# Patient Record
Sex: Male | Born: 1935 | Race: White | Hispanic: No
Health system: Southern US, Community
[De-identification: ages and names within clinical notes are randomized; demographics above are authoritative.]

## PROBLEM LIST (undated history)

## (undated) HISTORY — PX: CORONARY ARTERY BYPASS GRAFT: SHX141

---

## 2016-10-19 ENCOUNTER — Encounter (HOSPITAL_COMMUNITY): Payer: Self-pay | Admitting: Neurology

## 2016-10-19 ENCOUNTER — Inpatient Hospital Stay (HOSPITAL_COMMUNITY)
Admission: EM | Admit: 2016-10-19 | Discharge: 2016-10-21 | DRG: 194 | Disposition: A | Discharge status: Home / Self Care | Payer: Medicare Other | Attending: Internal Medicine | Admitting: Internal Medicine

## 2016-10-19 ENCOUNTER — Emergency Department (HOSPITAL_COMMUNITY): Payer: Medicare Other

## 2016-10-19 DIAGNOSIS — I739 Peripheral vascular disease, unspecified: Secondary | ICD-10-CM | POA: Diagnosis present

## 2016-10-19 DIAGNOSIS — R404 Transient alteration of awareness: Secondary | ICD-10-CM

## 2016-10-19 DIAGNOSIS — Z951 Presence of aortocoronary bypass graft: Secondary | ICD-10-CM | POA: Diagnosis not present

## 2016-10-19 DIAGNOSIS — E872 Acidosis: Secondary | ICD-10-CM | POA: Diagnosis present

## 2016-10-19 DIAGNOSIS — G309 Alzheimer's disease, unspecified: Secondary | ICD-10-CM | POA: Diagnosis present

## 2016-10-19 DIAGNOSIS — F0280 Dementia in other diseases classified elsewhere without behavioral disturbance: Secondary | ICD-10-CM | POA: Diagnosis present

## 2016-10-19 DIAGNOSIS — J189 Pneumonia, unspecified organism: Principal | ICD-10-CM | POA: Diagnosis present

## 2016-10-19 DIAGNOSIS — T68XXXA Hypothermia, initial encounter: Secondary | ICD-10-CM

## 2016-10-19 DIAGNOSIS — J431 Panlobular emphysema: Secondary | ICD-10-CM

## 2016-10-19 DIAGNOSIS — E785 Hyperlipidemia, unspecified: Secondary | ICD-10-CM | POA: Diagnosis present

## 2016-10-19 DIAGNOSIS — Z8249 Family history of ischemic heart disease and other diseases of the circulatory system: Secondary | ICD-10-CM

## 2016-10-19 DIAGNOSIS — Z720 Tobacco use: Secondary | ICD-10-CM

## 2016-10-19 DIAGNOSIS — F17210 Nicotine dependence, cigarettes, uncomplicated: Secondary | ICD-10-CM | POA: Diagnosis present

## 2016-10-19 DIAGNOSIS — I10 Essential (primary) hypertension: Secondary | ICD-10-CM | POA: Diagnosis present

## 2016-10-19 DIAGNOSIS — R680 Hypothermia, not associated with low environmental temperature: Secondary | ICD-10-CM | POA: Diagnosis present

## 2016-10-19 DIAGNOSIS — I2510 Atherosclerotic heart disease of native coronary artery without angina pectoris: Secondary | ICD-10-CM | POA: Diagnosis present

## 2016-10-19 DIAGNOSIS — F0390 Unspecified dementia without behavioral disturbance: Secondary | ICD-10-CM

## 2016-10-19 LAB — PROCALCITONIN: Procalcitonin: <0.1 ng/mL

## 2016-10-19 LAB — URINALYSIS, ROUTINE W REFLEX MICROSCOPIC
BILIRUBIN URINE: NEGATIVE
Glucose, UA: NEGATIVE mg/dL
HGB URINE DIPSTICK: NEGATIVE
Ketones, ur: NEGATIVE mg/dL
Leukocytes, UA: NEGATIVE
NITRITE: NEGATIVE
PROTEIN: NEGATIVE mg/dL
SPECIFIC GRAVITY, URINE: 1.009 (ref 1.005–1.030)
pH: 7 (ref 5.0–8.0)

## 2016-10-19 LAB — CBC WITH DIFFERENTIAL/PLATELET
Basophils Absolute: 0.1 10*3/uL (ref 0.0–0.1)
Basophils Relative: 1 %
EOS ABS: 0.2 10*3/uL (ref 0.0–0.7)
EOS PCT: 2 %
HCT: 47.3 % (ref 39.0–52.0)
Hemoglobin: 15.2 g/dL (ref 13.0–17.0)
Lymphocytes Relative: 22 %
Lymphs Abs: 2.1 10*3/uL (ref 0.7–4.0)
MCH: 29.5 pg (ref 26.0–34.0)
MCHC: 32.1 g/dL (ref 30.0–36.0)
MCV: 91.8 fL (ref 78.0–100.0)
MONO ABS: 0.4 10*3/uL (ref 0.1–1.0)
MONOS PCT: 5 %
Neutro Abs: 6.7 10*3/uL (ref 1.7–7.7)
Neutrophils Relative %: 70 %
PLATELETS: 257 10*3/uL (ref 150–400)
RBC: 5.15 MIL/uL (ref 4.22–5.81)
RDW: 14.4 % (ref 11.5–15.5)
WBC: 9.4 10*3/uL (ref 4.0–10.5)

## 2016-10-19 LAB — COMPREHENSIVE METABOLIC PANEL
ALT: 9 U/L — AB (ref 17–63)
ANION GAP: 8 (ref 5–15)
AST: 19 U/L (ref 15–41)
Albumin: 3.8 g/dL (ref 3.5–5.0)
Alkaline Phosphatase: 68 U/L (ref 38–126)
BUN: 16 mg/dL (ref 6–20)
CHLORIDE: 106 mmol/L (ref 101–111)
CO2: 24 mmol/L (ref 22–32)
CREATININE: 1.35 mg/dL — AB (ref 0.61–1.24)
Calcium: 8.6 mg/dL — ABNORMAL LOW (ref 8.9–10.3)
GFR, EST AFRICAN AMERICAN: 55 mL/min — AB (ref >60)
GFR, EST NON AFRICAN AMERICAN: 48 mL/min — AB (ref >60)
Glucose, Bld: 109 mg/dL — ABNORMAL HIGH (ref 65–99)
Potassium: 4 mmol/L (ref 3.5–5.1)
SODIUM: 138 mmol/L (ref 135–145)
Total Bilirubin: 0.5 mg/dL (ref 0.3–1.2)
Total Protein: 7 g/dL (ref 6.5–8.1)

## 2016-10-19 LAB — I-STAT CG4 LACTIC ACID, ED
LACTIC ACID, VENOUS: 0.81 mmol/L (ref 0.5–1.9)
Lactic Acid, Venous: 3.89 mmol/L (ref 0.5–1.9)

## 2016-10-19 LAB — INFLUENZA PANEL BY PCR (TYPE A & B)
INFLAPCR: NEGATIVE
INFLBPCR: NEGATIVE

## 2016-10-19 LAB — TROPONIN I

## 2016-10-19 LAB — I-STAT TROPONIN, ED: TROPONIN I, POC: 0.01 ng/mL (ref 0.00–0.08)

## 2016-10-19 MED ORDER — ALBUTEROL SULFATE (2.5 MG/3ML) 0.083% IN NEBU: 2.5000 mg | INHALATION_SOLUTION | RESPIRATORY_TRACT | Status: DC | PRN

## 2016-10-19 MED ORDER — AZITHROMYCIN 500 MG PO TABS
500.0000 mg | ORAL_TABLET | ORAL | Status: DC
  Administered 2016-10-20 – 2016-10-21 (×2): 500 mg via ORAL
  Filled 2016-10-19 (×2): qty 1

## 2016-10-19 MED ORDER — ASPIRIN EC 81 MG PO TBEC
81.0000 mg | DELAYED_RELEASE_TABLET | Freq: Every day | ORAL | Status: DC
  Administered 2016-10-20 – 2016-10-21 (×2): 81 mg via ORAL
  Filled 2016-10-19 (×2): qty 1

## 2016-10-19 MED ORDER — SODIUM CHLORIDE 0.9 % IV SOLN
Freq: Once | INTRAVENOUS | Status: AC
  Administered 2016-10-19: 12:00:00 via INTRAVENOUS

## 2016-10-19 MED ORDER — AMLODIPINE BESYLATE 10 MG PO TABS
10.0000 mg | ORAL_TABLET | Freq: Every day | ORAL | Status: DC
  Administered 2016-10-20 – 2016-10-21 (×2): 10 mg via ORAL
  Filled 2016-10-19 (×2): qty 1

## 2016-10-19 MED ORDER — PRAVASTATIN SODIUM 20 MG PO TABS
20.0000 mg | ORAL_TABLET | Freq: Every day | ORAL | Status: DC
  Administered 2016-10-20: 20 mg via ORAL
  Filled 2016-10-19: qty 1

## 2016-10-19 MED ORDER — DEXTROSE 5 % IV SOLN
500.0000 mg | Freq: Once | INTRAVENOUS | Status: AC
  Administered 2016-10-19: 500 mg via INTRAVENOUS
  Filled 2016-10-19: qty 500

## 2016-10-19 MED ORDER — ENOXAPARIN SODIUM 40 MG/0.4ML ~~LOC~~ SOLN
40.0000 mg | SUBCUTANEOUS | Status: DC
  Administered 2016-10-19 – 2016-10-20 (×2): 40 mg via SUBCUTANEOUS
  Filled 2016-10-19 (×2): qty 0.4

## 2016-10-19 MED ORDER — DEXTROSE 5 % IV SOLN
1.0000 g | INTRAVENOUS | Status: DC
  Administered 2016-10-20: 1 g via INTRAVENOUS
  Filled 2016-10-19 (×2): qty 10

## 2016-10-19 MED ORDER — ACETAMINOPHEN 650 MG RE SUPP: 650.0000 mg | Freq: Four times a day (QID) | RECTAL | Status: DC | PRN

## 2016-10-19 MED ORDER — POLYETHYLENE GLYCOL 3350 17 G PO PACK: 17.0000 g | PACK | Freq: Every day | ORAL | Status: DC | PRN

## 2016-10-19 MED ORDER — CEFTRIAXONE SODIUM 1 G IJ SOLR
1.0000 g | Freq: Once | INTRAMUSCULAR | Status: AC
  Administered 2016-10-19: 1 g via INTRAVENOUS
  Filled 2016-10-19: qty 10

## 2016-10-19 MED ORDER — SODIUM CHLORIDE 0.9% FLUSH
3.0000 mL | Freq: Two times a day (BID) | INTRAVENOUS | Status: DC
  Administered 2016-10-19 – 2016-10-21 (×3): 3 mL via INTRAVENOUS

## 2016-10-19 MED ORDER — SODIUM CHLORIDE 0.9 % IV BOLUS (SEPSIS)
1000.0000 mL | Freq: Once | INTRAVENOUS | Status: AC
  Administered 2016-10-19: 1000 mL via INTRAVENOUS

## 2016-10-19 MED ORDER — CLOPIDOGREL BISULFATE 75 MG PO TABS
75.0000 mg | ORAL_TABLET | Freq: Every day | ORAL | Status: DC
  Administered 2016-10-20 – 2016-10-21 (×2): 75 mg via ORAL
  Filled 2016-10-19 (×2): qty 1

## 2016-10-19 MED ORDER — SODIUM CHLORIDE 0.9 % IV SOLN
INTRAVENOUS | Status: DC
  Administered 2016-10-19 – 2016-10-20 (×2): via INTRAVENOUS

## 2016-10-19 MED ORDER — ACETAMINOPHEN 325 MG PO TABS: 650.0000 mg | ORAL_TABLET | Freq: Four times a day (QID) | ORAL | Status: DC | PRN

## 2016-10-19 NOTE — ED Notes (Signed)
Pt last oral temp was 98.5. Pt states he is getting too warm. Bair hugger removed. Will cont to monitor.

## 2016-10-19 NOTE — ED Triage Notes (Addendum)
Per ems- Pt was supposed go to his son's house this morning at 0800 to watch grand kids. He was late and his son looked outside and found him sitting in his truck with the windows down and truck turned off, truck was frosted. Pt unsure how long he was in the truck.  Pt was altered and confused. He was able to walk inside and sit down in front of the fireplace. EMS arrival patient was cool to touch with purple holds. Heat pack applied. While in transport pt HR dropped to 45, but increased to 70. Pt became more alert in transport. CBG 197.

## 2016-10-19 NOTE — H&P (Signed)
Date: 10/19/2016               Patient Name:  Joshua Wiley MRN: [id number]  DOB: Feb 16, 1933 Age / Sex: 83 y.o., male   PCP: Lindley MagnusAshley Long, PA-C         Medical Service: Internal Medicine Teaching Service         Attending Physician: Dr. Earl LagosNischal Narendra, MD    First Contact: Dr. Peggyann Juba'Sullivan Pager: [phone]  Second Contact: Dr. Earlene PlaterWallace Pager: [phone]       After Hours (After 5p/  First Contact Pager: [phone]  weekends / holidays): Second Contact Pager: [phone]   Chief Complaint: "I suddenly felt cold as an icicle."  History of Present Illness: Joshua Wiley is a 83 y.o. male with history of CAD (s/p CABG 2004), HTN, HL, Alzheimer's dementia (mild, good functional status), and PAD (s/p bilateral iliac stenting) presents with sudden onset hypothermia.  Collateral provided by daughter Misty StanleyLisa.  He was in his usual state of health yesterday with the exception of worsening of his baseline chronic cough and fatigue for the last several days, with increased sputum production.  This morning, he was planning to drive to his daughter's house to look after the grandchildren.  His son-in-law talked briefly to him on the phone to confirm the plans, and about 45 minutes later (about 20-30 minutes later than expected).  When he showed up, he seemed somewhat out of breath and very cold, but dressed appropriately with sweatshirt and winter coat.  He went inside and sat in front of the fire to warm up because he felt profoundly cold with chills.  He may have suffered transient reduced level of consciousness, sitting upright but slumping down.  In the ED, he was found to have T 95.7, and was actively warmed, started on empiric ceftriaxone and azithromycin, and given 1L NS.  Other than his worsening cough and fatigue, his ROS is negative, including for CP, edema, fevers/chills, weakness/numbness, and substance use.  He received flu shot this year.  Meds:  Current Meds  Medication Sig  . amLODipine  (NORVASC) 10 MG tablet Take 10 mg by mouth daily.  Marland Kitchen. aspirin EC 81 MG tablet Take 81 mg by mouth daily.  . clopidogrel (PLAVIX) 75 MG tablet Take 75 mg by mouth daily.  . pravastatin (PRAVACHOL) 20 MG tablet Take 20 mg by mouth daily.     Allergies: Allergies as of 10/19/2016  . (No Known Allergies)   Past Medical History  CAD s/p CABG HTN PAD s/p bilateral ileofemoral stents HL Inguinal hernia Dementia  Family History: HTN  Social History: Current 1 ppd smoker (~70 pk year history), rare alcohol with meals, no drugs  Review of Systems: A complete ROS was negative except as per HPI.  Physical Exam: Blood pressure 128/59, pulse (!) 58, temperature 98.5 F (36.9 C), temperature source Oral, resp. rate 14, weight 169 lb (76.7 kg), SpO2 95 %.  Physical Exam  Constitutional:  Alert, diaphoretic man in no distress under BAIR hugger  HENT:  Mouth/Throat: Oropharynx is clear and moist. No oropharyngeal exudate.  Eyes: Conjunctivae and EOM are normal. Pupils are equal, round, and reactive to light. No scleral icterus.  Neck: Normal range of motion. Neck supple.  Cardiovascular: Normal rate, regular rhythm and normal heart sounds.   Remote median sternom  Pulmonary/Chest:  No respiratory distress Good air movement Coarse rales in bilateral posterior lower lung fields No wheezes  Abdominal: Soft. He exhibits no distension. There is no  tenderness.  Musculoskeletal: He exhibits no edema or tenderness.  Neurological:  Alert and oriented Forgetful or recent and remote personal history.  Mental status at baseline per daughter CN intact Strength grossly intact in all extremities  Skin:  Warm, moist  Psychiatric: He has a normal mood and affect. His behavior is normal.   CBC Latest Ref Rng & Units 10/19/2016  WBC 4.0 - 10.5 K/uL 9.4  Hemoglobin 13.0 - 17.0 g/dL 16.1  Hematocrit 09.6 - 52.0 % 47.3  Platelets 150 - 400 K/uL 257   CMP Latest Ref Rng & Units 10/19/2016  Glucose 65  - 99 mg/dL 045(W)  BUN 6 - 20 mg/dL 16  Creatinine 0.98 - 1.19 mg/dL 1.47(W)  Sodium 295 - 621 mmol/L 138  Potassium 3.5 - 5.1 mmol/L 4.0  Chloride 101 - 111 mmol/L 106  CO2 22 - 32 mmol/L 24  Calcium 8.9 - 10.3 mg/dL 3.0(Q)  Total Protein 6.5 - 8.1 g/dL 7.0  Total Bilirubin 0.3 - 1.2 mg/dL 0.5  Alkaline Phos 38 - 126 U/L 68  AST 15 - 41 U/L 19  ALT 17 - 63 U/L 9(L)   Lactic Acid, Venous    Component Value Date/Time   LATICACIDVEN 3.89 (HH) 10/19/2016 1059   Component     Latest Ref Rng & Units 10/19/2016  Procalcitonin     ng/mL <0.10   CT Head 10/19/2016 IMPRESSION: 1. No acute intracranial abnormality. 2. Atrophy with chronic small vessel white matter ischemic disease. 3. 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  Chest Radiographs 10/19/2016 IMPRESSION: Bibasilar pneumonia and/or aspiration.  Background chronic lung disease which could be COPD and/or fibrosis.  EKG 10/19/2016 NSR, normal axis, no ST changes, TWI, Q waves  Assessment & Plan by Problem: Principal Problem:   Hypothermia Active Problems:   CAP (community acquired pneumonia)   Hypertension   Hyperlipidemia   Peripheral arterial disease (HCC)   Dementia   Tobacco use   83 y.o. male with hypothermia, AMS, lactic acidosis, and bibasilar opacities on CXR.  Environmental exposure seems less likely with only a short window of time unaccounted for and dressed appropriately.  Pneumonia and sepsis are most likely explanation.  #Hypothermia Most likely due to infection.  Other possibility include environmental exposure and MI.  -Troponin -Repeat EKG in AM -HIV  #CAP Flu PCR negative, procalcitonin low.  Lactic acidosis resolved. -Ceftriaxone and azithromycin -F/u BCx -Procalcitionin Q48H -Resp virus panel -Strep pneumo antigen -Sputum culture  #HTN Now hypertensive. -Continue home amlodipine,   #PAD #HL -Continue home  pravastatin, plavix, aspirin  #Tobacco Abuse -Declined nicotine patch  Fluids: NS 125 mL/hr Diet: heart healthy DVT Prophylaxis: lovenox Code Status: full  Dispo: Admit patient to Observation with expected length of stay less than 2 midnights.  Signed: Alm Bustard, MD 10/19/2016, 1:28 PM  Pager: [phone]

## 2016-10-19 NOTE — ED Provider Notes (Signed)
[facility]-EMERGENCY DEPT Provider Note   CSN: [id number] Arrival date & time: 10/19/16  2956     History   Chief Complaint Chief Complaint  Patient presents with  . Cold Exposure    HPI Joshua Wiley is a 83 y.o. male.  HPI Joshua Wiley is a 83 y.o. male with history of coronary disease, with CABG,  and peripheral arterial disease, presents to emergency department with hypothermia and altered mental status. Most of the history is provided by patient's son. From what patient recalls, he was feeling well this morning, woke up and was headed to his son's house to watch his children. He states he was able to get in the car and drive to the house and walk into the house. He does report feeling cold, otherwise he states he is not sure what happened and why he is here. According to son, he spoke with patient around 7 AM, patient was then supposed to get in the car and head over to his house. Patient did not get to his house until 8:15 AM. When he showed up at the door, he looked diaphoretic, pale, weak, disoriented, slurred speech. Patient appeared to be very cold. He sat him in front of the fireplace to warm hemoptysis and called EMS. EMS provided slightly different story, but confirmed with son. At this time, patient denies any pain. He is coughing, sneezing, watery draining eyes.     No past medical history on file.  There are no active problems to display for this patient.   Past Surgical History:  Procedure Laterality Date  . CORONARY ARTERY BYPASS GRAFT         Home Medications    Prior to Admission medications   Not on File    Family History No family history on file.  Social History Social History  Substance Use Topics  . Smoking status: Not on file  . Smokeless tobacco: Not on file  . Alcohol use Not on file     Allergies   Patient has no known allergies.   Review of Systems Review of Systems  Constitutional: Positive for diaphoresis. Negative for chills and  fever.  Eyes: Positive for discharge.  Respiratory: Positive for cough. Negative for chest tightness and shortness of breath.   Cardiovascular: Negative for chest pain, palpitations and leg swelling.  Gastrointestinal: Negative for abdominal distention, abdominal pain, diarrhea, nausea and vomiting.  Genitourinary: Negative for dysuria, frequency, hematuria and urgency.  Musculoskeletal: Negative for arthralgias, myalgias, neck pain and neck stiffness.  Skin: Negative for rash.  Allergic/Immunologic: Negative for immunocompromised state.  Neurological: Positive for weakness and light-headedness. Negative for numbness and headaches.  All other systems reviewed and are negative.    Physical Exam Updated Vital Signs BP (!) 163/105 (BP Location: Right Arm)   Pulse 69   Resp 16   SpO2 97%   Physical Exam  Constitutional: He is oriented to person, place, and time. He appears well-developed and well-nourished.  rigors present. Patient is coughing and sneezing  HENT:  Head: Normocephalic.  Right Ear: External ear normal.  Left Ear: External ear normal.  Nasal discharge.   Eyes: Pupils are equal, round, and reactive to light.  Bilateral purulent eye drainage, conjunctivae erythematous.  Neck: Normal range of motion. Neck supple.  Cardiovascular: Normal rate, regular rhythm, normal heart sounds and intact distal pulses.   Pulmonary/Chest: Effort normal. No respiratory distress. He has no wheezes. He has no rales.  Decreased air movement bilaterally  Abdominal: Soft. There is  no tenderness.  Musculoskeletal: Normal range of motion. He exhibits no edema.  Neurological: He is alert and oriented to person, place, and time. No cranial nerve deficit. Coordination normal.  Skin: Skin is warm and dry.  Nursing note and vitals reviewed.    ED Treatments / Results  Labs (all labs ordered are listed, but only abnormal results are displayed) Labs Reviewed  COMPREHENSIVE METABOLIC PANEL -  Abnormal; Notable for the following:       Result Value   Glucose, Bld 109 (*)    Creatinine, Ser 1.35 (*)    Calcium 8.6 (*)    ALT 9 (*)    GFR calc non Af Amer 48 (*)    GFR calc Af Amer 55 (*)    All other components within normal limits  I-STAT CG4 LACTIC ACID, ED - Abnormal; Notable for the following:    Lactic Acid, Venous 3.89 (*)    All other components within normal limits  CULTURE, BLOOD (ROUTINE X 2)  CULTURE, BLOOD (ROUTINE X 2)  URINE CULTURE  RESPIRATORY PANEL BY PCR  CBC WITH DIFFERENTIAL/PLATELET  URINALYSIS, ROUTINE W REFLEX MICROSCOPIC  INFLUENZA PANEL BY PCR (TYPE A & B, H1N1)  PROCALCITONIN  I-STAT TROPOININ, ED  I-STAT CG4 LACTIC ACID, ED    EKG  EKG Interpretation None       Radiology Dg Chest 2 View  Result Date: 10/19/2016 CLINICAL DATA:  Altered mental status EXAM: CHEST  2 VIEW COMPARISON:  None. FINDINGS: Bilateral basilar opacity with interstitial and airspace components. There is generalized interstitial coarsening and probable emphysematous change. Borderline cardiomegaly. Status post CABG. IMPRESSION: Bibasilar pneumonia and/or aspiration. Background chronic lung disease which could be COPD and/or fibrosis. Electronically Signed   By: Marnee SpringJonathon  Watts M.D.   On: 10/19/2016 11:22   Ct Head Wo Contrast  Result Date: 10/19/2016 CLINICAL DATA:  Mental status changes. EXAM: CT HEAD WITHOUT CONTRAST TECHNIQUE: Contiguous axial images were obtained from the base of the skull through the vertex without intravenous contrast. COMPARISON:  None. FINDINGS: Brain: There is no evidence for acute hemorrhage, hydrocephalus, mass lesion, or abnormal extra-axial fluid collection. No definite CT evidence for acute infarction. Diffuse loss of parenchymal volume is consistent with atrophy. Patchy low attenuation in the deep hemispheric and periventricular white matter is nonspecific, but likely reflects chronic microvascular ischemic demyelination. Old infarcts are seen  in the left frontoparietal region and parieto-occipital region. Lacunar infarcts noted inferior left cerebellum and right basal ganglia. Vascular: Atherosclerotic calcification is visualized in the carotid arteries. No dense MCA sign. Major dural sinuses are unremarkable. Skull: No evidence for fracture. No worrisome lytic or sclerotic lesion. Sinuses/Orbits: The visualized paranasal sinuses and mastoid air cells are clear. Visualized portions of the globes and intraorbital fat are unremarkable. Other: None. IMPRESSION: 1. No acute intracranial abnormality. 2. Atrophy with chronic small vessel white matter ischemic disease. 3. 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 Electronically Signed   By: Kennith CenterEric  Mansell M.D.   On: 10/19/2016 11:35    Procedures Procedures (including critical care time)  Medications Ordered in ED Medications  sodium chloride 0.9 % bolus 1,000 mL (not administered)     Initial Impression / Assessment and Plan / ED Course  I have reviewed the triage vital signs and the nursing notes.  Pertinent labs & imaging results that were available during my care of the patient were reviewed by me and considered in my medical decision  making (see chart for details).  Clinical Course    Patient seen and examined. Patient with episode of altered mental status, dizziness, slurred speech, diaphoresis, pale, possible cold exposure. According to the son, unsure what happened to the patient for proximally 45 minutes plus the drive to his house. Patient does feel cold to the touch. Unable to read oral temperature, patient is refusing rectal temperature. At this time he is alert and oriented, no complaints. Will warm with bare hugger and warm saline. His vital signs are normal otherwise, at this time I do not suspect sepsis, however will get blood work including lactic acid, blood cultures, chest x-ray, CT head, electrolytes  and blood counts.  12:01 PM CT head negative. Lactic acid is 3.89. Chest x-ray showing pneumonia. No recent admissions, will cover with Rocephin and Zithromax. Rectal temp is 95.7. In setting of hypothermia and pneumonia, will call code sepsis. Vital signs remain normal. Will get patient admitted for further evaluation and treatment. '  Sepsis - Repeat Assessment  Performed at:    1:00 PM  Vitals     Blood pressure 147/62, pulse (!) 55, temperature 98.5 F (36.9 C), temperature source Oral, resp. rate 13, weight 76.7 kg, SpO2 95 %.  Heart:     Regular rate and rhythm  Lungs:    Rhonchi  Capillary Refill:   <2 sec  Peripheral Pulse:   Radial pulse palpable  Skin:     Normal Color    Vitals:   10/19/16 1530 10/19/16 1600 10/19/16 1645 10/19/16 1700  BP: 130/82 138/58 149/66 147/62  Pulse: (!) 59 (!) 50 (!) 50 (!) 55  Resp: 21 12 14 13   Temp:      TempSrc:      SpO2: 94% 94% 93% 95%  Weight:         Final Clinical Impressions(s) / ED Diagnoses   Final diagnoses:  Community acquired pneumonia, unspecified laterality  Transient alteration of awareness    New Prescriptions Current Discharge Medication List       Jaynie Crumble, PA-C 10/19/16 1753    Shaune Pollack, MD 10/21/16 1030

## 2016-10-19 NOTE — ED Notes (Signed)
After several refusals for rectal temp pt agreed to have this performed, bear hugger applied and warm saline are already infusing - temp 95.7

## 2016-10-20 DIAGNOSIS — I739 Peripheral vascular disease, unspecified: Secondary | ICD-10-CM

## 2016-10-20 DIAGNOSIS — Z7982 Long term (current) use of aspirin: Secondary | ICD-10-CM

## 2016-10-20 DIAGNOSIS — F17210 Nicotine dependence, cigarettes, uncomplicated: Secondary | ICD-10-CM

## 2016-10-20 DIAGNOSIS — Z7902 Long term (current) use of antithrombotics/antiplatelets: Secondary | ICD-10-CM

## 2016-10-20 DIAGNOSIS — J189 Pneumonia, unspecified organism: Principal | ICD-10-CM

## 2016-10-20 DIAGNOSIS — Z8249 Family history of ischemic heart disease and other diseases of the circulatory system: Secondary | ICD-10-CM

## 2016-10-20 DIAGNOSIS — Z79899 Other long term (current) drug therapy: Secondary | ICD-10-CM

## 2016-10-20 DIAGNOSIS — I10 Essential (primary) hypertension: Secondary | ICD-10-CM

## 2016-10-20 LAB — BLOOD CULTURE ID PANEL (REFLEXED)
Acinetobacter baumannii: NOT DETECTED
CANDIDA ALBICANS: NOT DETECTED
CANDIDA KRUSEI: NOT DETECTED
CANDIDA PARAPSILOSIS: NOT DETECTED
Candida glabrata: NOT DETECTED
Candida tropicalis: NOT DETECTED
ESCHERICHIA COLI: NOT DETECTED
Enterobacter cloacae complex: NOT DETECTED
Enterobacteriaceae species: NOT DETECTED
Enterococcus species: NOT DETECTED
HAEMOPHILUS INFLUENZAE: NOT DETECTED
KLEBSIELLA OXYTOCA: NOT DETECTED
KLEBSIELLA PNEUMONIAE: NOT DETECTED
Listeria monocytogenes: NOT DETECTED
METHICILLIN RESISTANCE: NOT DETECTED
Neisseria meningitidis: NOT DETECTED
PSEUDOMONAS AERUGINOSA: NOT DETECTED
Proteus species: NOT DETECTED
SERRATIA MARCESCENS: NOT DETECTED
STAPHYLOCOCCUS AUREUS BCID: NOT DETECTED
STREPTOCOCCUS PNEUMONIAE: NOT DETECTED
STREPTOCOCCUS PYOGENES: NOT DETECTED
Staphylococcus species: DETECTED — AB
Streptococcus agalactiae: NOT DETECTED
Streptococcus species: NOT DETECTED

## 2016-10-20 LAB — RESPIRATORY PANEL BY PCR
ADENOVIRUS-RVPPCR: NOT DETECTED
Bordetella pertussis: NOT DETECTED
CHLAMYDOPHILA PNEUMONIAE-RVPPCR: NOT DETECTED
CORONAVIRUS NL63-RVPPCR: NOT DETECTED
Coronavirus 229E: NOT DETECTED
Coronavirus HKU1: NOT DETECTED
Coronavirus OC43: NOT DETECTED
INFLUENZA A-RVPPCR: NOT DETECTED
Influenza B: NOT DETECTED
METAPNEUMOVIRUS-RVPPCR: NOT DETECTED
Mycoplasma pneumoniae: NOT DETECTED
PARAINFLUENZA VIRUS 2-RVPPCR: NOT DETECTED
PARAINFLUENZA VIRUS 3-RVPPCR: NOT DETECTED
Parainfluenza Virus 1: NOT DETECTED
Parainfluenza Virus 4: NOT DETECTED
RHINOVIRUS / ENTEROVIRUS - RVPPCR: NOT DETECTED
Respiratory Syncytial Virus: NOT DETECTED

## 2016-10-20 LAB — BASIC METABOLIC PANEL
Anion gap: 10 (ref 5–15)
BUN: 12 mg/dL (ref 6–20)
CALCIUM: 9 mg/dL (ref 8.9–10.3)
CHLORIDE: 109 mmol/L (ref 101–111)
CO2: 21 mmol/L — AB (ref 22–32)
CREATININE: 1.22 mg/dL (ref 0.61–1.24)
GFR calc Af Amer: >60 mL/min (ref >60)
GFR calc non Af Amer: 54 mL/min — ABNORMAL LOW (ref >60)
GLUCOSE: 85 mg/dL (ref 65–99)
Potassium: 4.1 mmol/L (ref 3.5–5.1)
Sodium: 140 mmol/L (ref 135–145)

## 2016-10-20 LAB — CBC
HCT: 45.8 % (ref 39.0–52.0)
Hemoglobin: 15.2 g/dL (ref 13.0–17.0)
MCH: 29.6 pg (ref 26.0–34.0)
MCHC: 33.2 g/dL (ref 30.0–36.0)
MCV: 89.3 fL (ref 78.0–100.0)
PLATELETS: 251 10*3/uL (ref 150–400)
RBC: 5.13 MIL/uL (ref 4.22–5.81)
RDW: 13.9 % (ref 11.5–15.5)
WBC: 13.1 10*3/uL — ABNORMAL HIGH (ref 4.0–10.5)

## 2016-10-20 LAB — STREP PNEUMONIAE URINARY ANTIGEN: STREP PNEUMO URINARY ANTIGEN: NEGATIVE

## 2016-10-20 LAB — URINE CULTURE: Culture: <10000 — AB

## 2016-10-20 LAB — HIV ANTIBODY (ROUTINE TESTING W REFLEX): HIV Screen 4th Generation wRfx: NONREACTIVE

## 2016-10-20 NOTE — Progress Notes (Signed)
   Subjective: Feels well, with no chills, or dyspnea.  Per daughter Misty StanleyLisa he continue to have a cough worse than baseline, but mental status is at his baseline.  Objective:  Vital signs in last 24 hours: Vitals:   10/19/16 1802 10/19/16 2258 10/20/16 0347 10/20/16 0622  BP: (!) 173/55 (!) 142/77  (!) 141/48  Pulse: (!) 58 66  (!) 42  Resp: 20 16  17   Temp: 97.6 F (36.4 C) 97.6 F (36.4 C)  97.5 F (36.4 C)  TempSrc: Oral Oral  Oral  SpO2: 97% 97%  96%  Weight: 166 lb 9.6 oz (75.6 kg)  159 lb 6.4 oz (72.3 kg)   Height: 5\' 10"  (1.778 m)      Physical Exam  Constitutional: He is oriented to person, place, and time.  Sitting in chair eating lunch in no distress  Cardiovascular: Normal rate, regular rhythm and normal heart sounds.   Pulmonary/Chest: Effort normal. No respiratory distress.  Coarse bibasilar rales  Abdominal: Soft. He exhibits no distension. There is no tenderness.  Neurological: He is alert and oriented to person, place, and time.  Skin: Skin is warm and dry.  Psychiatric: He has a normal mood and affect. His behavior is normal.   EKG 10/20/2016 Sinus bradycardia, normal axis, no ST changes, TWI, Q waves.  Biphasic P in V1, possible LA enlargement.  CBC Latest Ref Rng & Units 10/20/2016 10/19/2016  WBC 4.0 - 10.5 K/uL 13.1(H) 9.4  Hemoglobin 13.0 - 17.0 g/dL 13.215.2 44.015.2  Hematocrit 10.239.0 - 52.0 % 45.8 47.3  Platelets 150 - 400 K/uL 251 257   BMP Latest Ref Rng & Units 10/20/2016 10/19/2016  Glucose 65 - 99 mg/dL 85 725(D109(H)  BUN 6 - 20 mg/dL 12 16  Creatinine 6.640.61 - 1.24 mg/dL 4.031.22 4.74(Q1.35(H)  Sodium 595135 - 145 mmol/L 140 138  Potassium 3.5 - 5.1 mmol/L 4.1 4.0  Chloride 101 - 111 mmol/L 109 106  CO2 22 - 32 mmol/L 21(L) 24  Calcium 8.9 - 10.3 mg/dL 9.0 6.3(O8.6(L)   Cardiac Panel (last 3 results)  Recent Labs  10/19/16 2103  TROPONINI <0.03   Blood Cultures 10/19/2016 2/2 cultures growing GPC in clusters BCID Staph species, neg Staph aureus, neg methicillin  resistance  Assessment/Plan:  Principal Problem:   Hypothermia Active Problems:   CAP (community acquired pneumonia)   Hypertension   Hyperlipidemia   Peripheral arterial disease (HCC)   Dementia   Tobacco use  83 y.o. male with hypothermia, AMS, lactic acidosis, and bibasilar opacities on CXR consistent with pneumonia.  His blood cultures are growing coag negative staph per BCID, awaiting culture speciation and sensitivities.  He is clinically stable with stable hemodynamics and respiratory function.  #CAP Blood cultures growing Staph which BCID suggests is coagulase negative.  Flu PCR negative, respiratory virus PCR negative, procalcitonin low, lactic acidosis resolved. -Ceftriaxone and azithromycin for CAP (day 2, started 10/19/2016) -F/u BCx -Procalcitionin Q48H -F/u Strep pneumo antigen -F/u Sputum culture  #Hypothermia Most likely due to infection.  Other possibility include environmental exposure.  MI unlikely with nonischemic EKG and undetectable troponin.  #HTN No current concern for hypotension/sepsis with mildly elevated BPs. -Continue home amlodipine  #PAD #HL -Continue home pravastatin, plavix, aspirin  Fluids: none Diet: heart DVT Prophylaxis: lovenox Code Status: full  Dispo: Anticipated discharge in approximately 1-2 day(s).   Alm BustardMatthew O'Sullivan, MD 10/20/2016, 9:25 AM Pager: [phone]

## 2016-10-20 NOTE — Progress Notes (Signed)
  Date: 10/20/2016  Patient name: Joshua Wiley  Medical record number: [id number]  Date of birth: [birth year]   I have seen and evaluated Joshua Wiley and discussed their care with the Residency Team. In brief, patient is a 83 y/o male with PMH of CAD s/p CABG, HTN, HLD, mild dementia, PAD who p/w hypothermia.  Patient was feeling well the morning of admission but when he arrived at his daughter's house later that morning he was noted to be very cold and had chills as well as somnolence. He sat in front of the fire to warm up but had persistent symptoms so was brought to the hospital. In ED was found to have a temp of 95.7 F and was started on empiric abx as well as a warming blanket. He denies fevers, no abd pain, no n/v, no diarrhea, no syncope, no focal weakness, no CP, no SOB, no palpitations, no diaphoresis.  Today, patient feels well and says he is back to his baseline  PMHx, Fam Hx, and/or Soc Hx : as per resident admit note  Vitals:   10/20/16 0622 10/20/16 0930  BP: (!) 141/48 (!) 151/52  Pulse: (!) 42   Resp: 17   Temp: 97.5 F (36.4 C)    Gen: AAO*3, NAD CVS: RRR, normal heart sounds Lungs: bibasilar crackles + Abd: soft, non tender, BS + Ext: no edema  Assessment and Plan: I have seen and evaluated the patient as outlined above. I agree with the formulated Assessment and Plan as detailed in the residents' note, with the following changes:   1. Community acquired PNA: - Patient presents with hypothermia, somnolence and found to have an elevated lactic acid on admission, now with leukocytosis as well with bibasilar infiltrates on CXR consistent with PNA - c/w ceftriaxone and azithromycin for now - Patient with positive blood cx with gram + cocci in clusters consistent with staph but ID panel does not show staph aureus. I am inclined to believe that this is a contaminant - given that one is growing in the aerobic bottle only and the other is growing only in the anaerobic bottle  and his symptoms are consistent with a PNA which would unlikely be caused by a staph bacteria that is not staph aureus. - Will await speciation of blood cx. Consider repeat blood cx in AM - f/u sputum cx and strep pneumo antigen    Earl LagosNischal Rogen Porte, MD 1/3/20188:17 PM

## 2016-10-20 NOTE — Progress Notes (Signed)
PHARMACY - PHYSICIAN COMMUNICATION CRITICAL VALUE ALERT - BLOOD CULTURE IDENTIFICATION (BCID)  Results for orders placed or performed during the hospital encounter of 10/19/16  Blood Culture ID Panel (Reflexed) (Collected: 10/19/2016 10:42 AM)  Result Value Ref Range   Enterococcus species NOT DETECTED NOT DETECTED   Listeria monocytogenes NOT DETECTED NOT DETECTED   Staphylococcus species DETECTED (A) NOT DETECTED   Staphylococcus aureus NOT DETECTED NOT DETECTED   Methicillin resistance NOT DETECTED NOT DETECTED   Streptococcus species NOT DETECTED NOT DETECTED   Streptococcus agalactiae NOT DETECTED NOT DETECTED   Streptococcus pneumoniae NOT DETECTED NOT DETECTED   Streptococcus pyogenes NOT DETECTED NOT DETECTED   Acinetobacter baumannii NOT DETECTED NOT DETECTED   Enterobacteriaceae species NOT DETECTED NOT DETECTED   Enterobacter cloacae complex NOT DETECTED NOT DETECTED   Escherichia coli NOT DETECTED NOT DETECTED   Klebsiella oxytoca NOT DETECTED NOT DETECTED   Klebsiella pneumoniae NOT DETECTED NOT DETECTED   Proteus species NOT DETECTED NOT DETECTED   Serratia marcescens NOT DETECTED NOT DETECTED   Haemophilus influenzae NOT DETECTED NOT DETECTED   Neisseria meningitidis NOT DETECTED NOT DETECTED   Pseudomonas aeruginosa NOT DETECTED NOT DETECTED   Candida albicans NOT DETECTED NOT DETECTED   Candida glabrata NOT DETECTED NOT DETECTED   Candida krusei NOT DETECTED NOT DETECTED   Candida parapsilosis NOT DETECTED NOT DETECTED   Candida tropicalis NOT DETECTED NOT DETECTED    Name of physician (or Provider) Contacted: Dr. Peggyann Juba'Sullivan   Changes to prescribed antibiotics required: Patient is currently on ceftriaxone and azithromycin for CAP. Given that this is likely contaminant, no changes to current therapy at this time.   Carylon PerchesMaggie Shuda, PharmD Acute Care Pharmacy Resident  Pager: [phone] 10/20/2016

## 2016-10-20 NOTE — Evaluation (Signed)
Physical Therapy Evaluation Patient Details Name: Joshua Wiley MRN: [id number] DOB: 12-02-1932 Today's Date: 10/20/2016   History of Present Illness  Jonathan Corpus is a 83 y.o. male with history of CAD (s/p CABG 2004), HTN, HL, Alzheimer's dementia (mild, good functional status), and PAD (s/p bilateral iliac stenting) presents with sudden onset hypothermia.  Clinical Impression  Patient presents with some deficits in balance slightly more than baseline, but feel he will improve as resumes normal daily routine with son's supervision.  No current plans for follow up PT and feel can mobilize safely with nursing assist.  Will sign off.    Follow Up Recommendations No PT follow up    Equipment Recommendations  None recommended by PT    Recommendations for Other Services       Precautions / Restrictions Precautions Precautions: Fall      Mobility  Bed Mobility               General bed mobility comments: up in chair  Transfers Overall transfer level: Needs assistance Equipment used: None Transfers: Sit to/from Stand Sit to Stand: Supervision         General transfer comment: reliant on UE suppot and slow to rise  Ambulation/Gait Ambulation/Gait assistance: Supervision;Min guard Ambulation Distance (Feet): 200 Feet Assistive device: None Gait Pattern/deviations: Step-through pattern;Drifts right/left;Decreased stride length;Scissoring     General Gait Details: some cross steps to catch balance, veers to sides at times esp with head turns, occasional minguard for safety   Stairs            Wheelchair Mobility    Modified Rankin (Stroke Patients Only)       Balance Overall balance assessment: Needs assistance           Standing balance-Leahy Scale: Good                               Pertinent Vitals/Pain Pain Assessment: No/denies pain    Home Living Family/patient expects to be discharged to:: Private residence Living Arrangements:  Children Available Help at Discharge: Family;Available PRN/intermittently Type of Home: House Home Access: Level entry     Home Layout: One level Home Equipment: Cane - single point      Prior Function Level of Independence: Independent               Hand Dominance        Extremity/Trunk Assessment   Upper Extremity Assessment Upper Extremity Assessment: Overall WFL for tasks assessed    Lower Extremity Assessment Lower Extremity Assessment: LLE deficits/detail;RLE deficits/detail RLE Deficits / Details: WFL AROM and strength; reports LE numbness since getting cold RLE Sensation: decreased light touch LLE Deficits / Details: WFL AROM and strength; reports LE numbness since getting cold LLE Sensation: decreased light touch       Communication   Communication: No difficulties  Cognition Arousal/Alertness: Awake/alert Behavior During Therapy: WFL for tasks assessed/performed Overall Cognitive Status: History of cognitive impairments - at baseline                      General Comments General comments (skin integrity, edema, etc.): discussed with pt and daughter fall prevention tips for home and having initial supervision    Exercises     Assessment/Plan    PT Assessment Patent does not need any further PT services  PT Problem List            PT  Treatment Interventions      PT Goals (Current goals can be found in the Care Plan section)  Acute Rehab PT Goals Patient Stated Goal: To go home PT Goal Formulation: All assessment and education complete, DC therapy    Frequency     Barriers to discharge        Co-evaluation               End of Session Equipment Utilized During Treatment: Gait belt Activity Tolerance: Patient tolerated treatment well Patient left: in chair;with call bell/phone within reach;with family/visitor present      Functional Assessment Tool Used: Clinical Judgement Functional Limitation: Mobility: Walking  and moving around Mobility: Walking and Moving Around Current Status ([id number]): At least 1 percent but less than 20 percent impaired, limited or restricted Mobility: Walking and Moving Around Goal Status [phone]): At least 1 percent but less than 20 percent impaired, limited or restricted Mobility: Walking and Moving Around Discharge Status [phone](G8980): At least 1 percent but less than 20 percent impaired, limited or restricted    Time: [id number] PT Time Calculation (min) (ACUTE ONLY): 22 min   Charges:   PT Evaluation $PT Eval Moderate Complexity: 1 Procedure     PT G Codes:   PT G-Codes **NOT FOR INPATIENT CLASS** Functional Assessment Tool Used: Clinical Judgement Functional Limitation: Mobility: Walking and moving around Mobility: Walking and Moving Around Current Status ([id number](G8978): At least 1 percent but less than 20 percent impaired, limited or restricted Mobility: Walking and Moving Around Goal Status [phone]([phone]): At least 1 percent but less than 20 percent impaired, limited or restricted Mobility: Walking and Moving Around Discharge Status [phone]([phone]): At least 1 percent but less than 20 percent impaired, limited or restricted    Elray McgregorCynthia Tekoa Amon 10/20/2016, 12:56 PM  Sheran Lawlessyndi Kamuela Magos, PT [phone] 10/20/2016

## 2016-10-21 DIAGNOSIS — J431 Panlobular emphysema: Secondary | ICD-10-CM

## 2016-10-21 DIAGNOSIS — R404 Transient alteration of awareness: Secondary | ICD-10-CM

## 2016-10-21 LAB — BASIC METABOLIC PANEL
Anion gap: 10 (ref 5–15)
BUN: 13 mg/dL (ref 6–20)
CO2: 25 mmol/L (ref 22–32)
CREATININE: 1.37 mg/dL — AB (ref 0.61–1.24)
Calcium: 9.4 mg/dL (ref 8.9–10.3)
Chloride: 104 mmol/L (ref 101–111)
GFR calc Af Amer: 54 mL/min — ABNORMAL LOW (ref >60)
GFR, EST NON AFRICAN AMERICAN: 47 mL/min — AB (ref >60)
Glucose, Bld: 100 mg/dL — ABNORMAL HIGH (ref 65–99)
Potassium: 4.7 mmol/L (ref 3.5–5.1)
SODIUM: 139 mmol/L (ref 135–145)

## 2016-10-21 LAB — CBC
HCT: 48.7 % (ref 39.0–52.0)
Hemoglobin: 16.2 g/dL (ref 13.0–17.0)
MCH: 29.5 pg (ref 26.0–34.0)
MCHC: 33.3 g/dL (ref 30.0–36.0)
MCV: 88.7 fL (ref 78.0–100.0)
Platelets: 244 10*3/uL (ref 150–400)
RBC: 5.49 MIL/uL (ref 4.22–5.81)
RDW: 13.8 % (ref 11.5–15.5)
WBC: 10.4 10*3/uL (ref 4.0–10.5)

## 2016-10-21 LAB — PROCALCITONIN: Procalcitonin: <0.1 ng/mL

## 2016-10-21 MED ORDER — NICOTINE 14 MG/24HR TD PT24: 14.0000 mg | MEDICATED_PATCH | Freq: Every day | TRANSDERMAL | 0 refills | Status: AC

## 2016-10-21 MED ORDER — CEFDINIR 300 MG PO CAPS: 300.0000 mg | ORAL_CAPSULE | Freq: Two times a day (BID) | ORAL | 0 refills | Status: AC

## 2016-10-21 MED ORDER — DEXTROSE 5 % IV SOLN
2.0000 g | INTRAVENOUS | Status: DC
  Administered 2016-10-21: 2 g via INTRAVENOUS
  Filled 2016-10-21: qty 2

## 2016-10-21 MED ORDER — AZITHROMYCIN 500 MG PO TABS: 500.0000 mg | ORAL_TABLET | ORAL | 0 refills | Status: AC

## 2016-10-21 NOTE — Progress Notes (Addendum)
Subjective: Feels well, with no chills, or dyspnea.  Wants to go home.  Objective:  Vital signs in last 24 hours: Vitals:   10/20/16 2128 10/21/16 0547 10/21/16 0619 10/21/16 1053  BP: (!) 163/63 (!) 166/71 (!) 146/73 (!) 162/83  Pulse: (!) 51 60 (!) 57   Resp: 18 16    Temp: 97.6 F (36.4 C) 97.6 F (36.4 C)    TempSrc: Oral Oral    SpO2: 100% 97%    Weight:   160 lb 6.4 oz (72.8 kg)   Height:       Physical Exam  Constitutional: He is oriented to person, place, and time.  Sitting in chair and in no distress  Cardiovascular: Normal rate, regular rhythm and normal heart sounds.   Pulmonary/Chest: Effort normal. No respiratory distress.  Bibasilar crackles.  Abdominal: Soft. He exhibits no distension. There is no tenderness.  Neurological: He is alert and oriented to person, place, and time.  Skin: Skin is warm and dry.  Psychiatric: He has a normal mood and affect. His behavior is normal.   EKG 10/20/2016 Sinus bradycardia, normal axis, no ST changes, TWI, Q waves.  Biphasic P in V1, possible LA enlargement.  CBC Latest Ref Rng & Units 10/21/2016 10/20/2016 10/19/2016  WBC 4.0 - 10.5 K/uL 10.4 13.1(H) 9.4  Hemoglobin 13.0 - 17.0 g/dL 16.1 09.6 04.5  Hematocrit 39.0 - 52.0 % 48.7 45.8 47.3  Platelets 150 - 400 K/uL 244 251 257   BMP Latest Ref Rng & Units 10/21/2016 10/20/2016 10/19/2016  Glucose 65 - 99 mg/dL 409(W) 85 119(J)  BUN 6 - 20 mg/dL 13 12 16   Creatinine 0.61 - 1.24 mg/dL 4.78(G) 9.56 2.13(Y)  Sodium 135 - 145 mmol/L 139 140 138  Potassium 3.5 - 5.1 mmol/L 4.7 4.1 4.0  Chloride 101 - 111 mmol/L 104 109 106  CO2 22 - 32 mmol/L 25 21(L) 24  Calcium 8.9 - 10.3 mg/dL 9.4 9.0 8.6(V)   Cardiac Panel (last 3 results)  Recent Labs  10/19/16 2103  TROPONINI <0.03   Blood Cultures 10/19/2016 2/2 cultures growing GPC in clusters (one in aerobic only, one in anaerobic only) BCID Staph species, neg Staph aureus, neg methicillin resistance  Assessment/Plan:  Principal  Problem:   Hypothermia Active Problems:   CAP (community acquired pneumonia)   Hypertension   Hyperlipidemia   Peripheral arterial disease (HCC)   Dementia   Tobacco use  83 y.o. male with hypothermia, AMS, lactic acidosis, and bibasilar opacities on CXR consistent with pneumonia.  His blood cultures are growing coag negative staph per BCID, awaiting culture speciation and sensitivities.  He is clinically stable with stable hemodynamics and respiratory function.  #CAP Blood cultures growing GPC in clusters which BCID shows as Staph species but not Staph aureus and no methicillin resistance.  Flu PCR negative, respiratory virus PCR negative, procalcitonin low, lactic acidosis resolved. -Will continue with antibiotics for CAP treatment as outpatient - Cefdinir (end date 1/12) and Azithromycin (end date 1/6) -Blood cultures growing likely CoNS which may very well be contaminant.  Discussed with ID this morning who recommended extending course of Cefdinir for 10 days total -F/u final speciation -Repeat blood cx today  #Hypothermia Most likely due to infection.  Other possibility include environmental exposure.  MI unlikely with nonischemic EKG and undetectable troponin.  #HTN No current concern for hypotension/sepsis with mildly elevated BPs. -Continue home amlodipine  #PAD #HL -Continue home pravastatin, plavix, aspirin  Fluids: none Diet: heart DVT Prophylaxis: lovenox  Code Status: full  Dispo: Anticipated discharge today.  Gwynn BurlyAndrew Leonid Manus, DO 10/21/2016, 12:11 PM Pager: [phone]

## 2016-10-21 NOTE — Discharge Summary (Signed)
Name: Joshua Wiley MRN: [id number] DOB: 02-27-1933 83 y.o. PCP: Lindley MagnusAshley Long, PA-C  Date of Admission: 10/19/2016  9:53 AM Date of Discharge: 10/21/2016 Attending Physician: Earl LagosNischal Narendra, MD  Discharge Diagnosis:  Principal Problem:   Community acquired pneumonia Active Problems:   Hypothermia   Hypertension   Hyperlipidemia   Peripheral arterial disease (HCC)   Dementia   Tobacco use   Transient alteration of awareness   Panlobular emphysema (HCC)   Discharge Medications: Allergies as of 10/21/2016   No Known Allergies     Medication List    TAKE these medications   amLODipine 10 MG tablet Commonly known as:  NORVASC Take 10 mg by mouth daily.   aspirin EC 81 MG tablet Take 81 mg by mouth daily.   azithromycin 500 MG tablet Commonly known as:  ZITHROMAX Take 1 tablet (500 mg total) by mouth daily. Start taking on:  10/22/2016   cefdinir 300 MG capsule Commonly known as:  OMNICEF Take 1 capsule (300 mg total) by mouth 2 (two) times daily. Start taking on:  10/22/2016   clopidogrel 75 MG tablet Commonly known as:  PLAVIX Take 75 mg by mouth daily.   pravastatin 20 MG tablet Commonly known as:  PRAVACHOL Take 20 mg by mouth daily.       Disposition and follow-up:   Joshua.Joshua Wiley [facility] Stable [facility].  At the hospital follow up visit please address:  1.  Pneumonia.  Ask about symptoms of dyspnea, cough, fever, chills.  2.  Labs / imaging needed at time of follow-up: none  3.  Pending labs/ test needing follow-up: Blood cultures  Follow-up Appointments: Follow-up Information    LONG,ASHLEY B, PA-C. Schedule an appointment as soon as possible for a visit in 1 week(s).   Specialty:  Physician Assistant Contact information: [address] [place] [address] [phone]           Hospital Course by problem list: Principal Problem:   Hypothermia Active Problems:   CAP (community  acquired pneumonia)   Hypertension   Hyperlipidemia   Peripheral arterial disease (HCC)   Dementia   Tobacco use   1. Community Acquired Pneumonia Joshua Wiley was brought to the emergency department after complaining of acute onset profound chills and seeming altered.  He was found to be hypothermic to 95*F, had lactic acidosis, and bibasilar course rales congruent with bibasilar inflitrates on chest radiographs.  He did not have a leukocytosis, procalcitionin was low, influenza and respiratory virus panel negative, normotensive, and normal head CT. He was actively warmed, given IV fluids, and started on empiric ceftriaxone and azithromycin for community acquired pneumonia.  He rapidly improved, lactic acidosis resolved, and mental status returned to his baseline, at which he has mild dementia.  Blood cultures grew coagulase-negative staph, most likely representing contamination.  He was discharged to complete 5 days of azithromycin and 10 days of 3rd generation cephalosporin with cefdinir.  2. CAD With his history of CAD, the possibility of ACS was ruled out as an etiology of hypothermia and altered mental status.  He had nonischemic EKGs and undetectable troponin.  Continued on pravastatin, aspirin, and plavix.  3.  HTN Continued on home amlodipine, normotensive to slightly hypertensive.  Discharge Vitals:   BP (!) 162/83 (BP Location: Right Arm)   Pulse (!) 57   Temp 97.6 F (36.4 C) (Oral)   Resp 16   Ht 5\' 10"  (1.778 m)   Wt  160 lb 6.4 oz (72.8 kg)   SpO2 97%   BMI 23.02 kg/m   Pertinent Labs, Studies, and Procedures:   CBC Latest Ref Rng & Units 10/21/2016 10/20/2016 10/19/2016  WBC 4.0 - 10.5 K/uL 10.4 13.1(H) 9.4  Hemoglobin 13.0 - 17.0 g/dL 40.9 81.1 91.4  Hematocrit 39.0 - 52.0 % 48.7 45.8 47.3  Platelets 150 - 400 K/uL 244 251 257   BMP Latest Ref Rng & Units 10/21/2016 10/20/2016 10/19/2016  Glucose 65 - 99 mg/dL 782(N) 85 562(Z)  BUN 6 - 20 mg/dL 13 12 16   Creatinine 0.61 - 1.24  mg/dL 3.08(M) 5.78 4.69(G)  Sodium 135 - 145 mmol/L 139 140 138  Potassium 3.5 - 5.1 mmol/L 4.7 4.1 4.0  Chloride 101 - 111 mmol/L 104 109 106  CO2 22 - 32 mmol/L 25 21(L) 24  Calcium 8.9 - 10.3 mg/dL 9.4 9.0 2.9(B)   Blood Cultures x2 10/19/2016 Coagulase-negative staphylococcus x2 Resistant to TMP-SMX, Erythromycin, Oxacillin  Blood Cultures x2 10/21/2016 No growth 1 day  Chest Radiographs 10/19/2016 IMPRESSION: Bibasilar pneumonia and/or aspiration.  Background chronic lung disease which could be COPD and/or fibrosis.  Influenza negative  Discharge Instructions: Discharge Instructions    Call MD for:  difficulty breathing, headache or visual disturbances    Complete by:  As directed    Call MD for:  extreme fatigue    Complete by:  As directed    Call MD for:  hives    Complete by:  As directed    Call MD for:  persistant dizziness or light-headedness    Complete by:  As directed    Call MD for:  persistant nausea and vomiting    Complete by:  As directed    Call MD for:  redness, tenderness, or signs of infection (pain, swelling, redness, odor or green/yellow discharge around incision site)    Complete by:  As directed    Call MD for:  severe uncontrolled pain    Complete by:  As directed    Call MD for:  temperature >100.4    Complete by:  As directed    Diet - low sodium heart healthy    Complete by:  As directed    Discharge instructions    Complete by:  As directed    Joshua. Wiley,  It was a pleasure taking care of you while in the hospital the past couple of days.  We are treating you for an infection in your lungs with 2 antibiotics.  You will take Azithromycin for 2 more days starting tomorrow morning.  You will also take Cefdinir for 7 more days starting tomorrow morning as well.  This 2nd medication will also treat any potential bacterial infection in your blood stream.  However, we think this is likely a contaminant from your normal skin flora.  Please schedule a  hospital follow up with your primary doctor in a week or so.   Take care, Dr. Earlene Plater   Increase activity slowly    Complete by:  As directed       Signed: Alm Bustard, MD 10/22/2016, 12:31 PM   Pager: [phone]

## 2016-10-21 NOTE — Discharge Summary (Signed)
Medicine attending discharge note: I personally examined this patient today together with resident physician Dr. Gwynn BurlyAndrew Wallace and I concur with his discharge evaluation and plan as recorded in the vital recorded progress note dated 10/21/2016.  83 year old man with history of hypertension, peripheral vascular disease, mild dementia and coronary artery disease 10 years status post bypass surgery who presented on January 2 with increasing cough, fatigue, dyspnea, and transient confusion, disorientation, and dysarthria. Upon arrival in the emergency department he was oriented 3, he was shivering, coughing, and sneezing. He was hypothermic with rectal temperature 95.7. Initial blood pressure 163/105, pulse 69 and regular, respirations 16, oxygen saturation 97% on room air. Bilateral purulent eye drainage and nasal discharge noted. Decreased air movements over the lungs. A lactic acid was 3.89. Negative for influenza by PCR, negative respiratory virus panel, HIV screen negative. A chest x-ray showed changes consistent with obstructive airway disease. Bibasilar interstitial infiltrates felt consistent with bibasilar pneumonia. Borderline cardiomegaly and changes from previous bypass surgery.  Hospital course: He was started on antibiotics to cover a community-acquired pneumonia. Warming blanket applied. Temperature normalized. He has improved rapidly. 2 out of 2 blood cultures are growing gram-positive cocci in clusters. No evidence for methicillin sensitive or resistant Staphylococcus species. Suspect contaminant. We will obtain repeat blood cultures today. In view of clinical stability and improvement, we will transition to oral antibiotics with cefdinar to complete a ten-day course.  Disposition: Condition stable at time of discharge There were no complications

## 2016-10-21 NOTE — Progress Notes (Signed)
Nsg Discharge Note  Admit Date:  10/19/2016 Discharge date: 10/21/2016   Bethel BornWilliam Snowball to be D/C'd home per MD order.  AVS completed.  Copy for chart, and copy for patient signed, and dated. Patient/caregiver able to verbalize understanding.  Discharge Medication: Allergies as of 10/21/2016   No Known Allergies     Medication List    TAKE these medications   amLODipine 10 MG tablet Commonly known as:  NORVASC Take 10 mg by mouth daily.   aspirin EC 81 MG tablet Take 81 mg by mouth daily.   azithromycin 500 MG tablet Commonly known as:  ZITHROMAX Take 1 tablet (500 mg total) by mouth daily. Start taking on:  10/22/2016   cefdinir 300 MG capsule Commonly known as:  OMNICEF Take 1 capsule (300 mg total) by mouth 2 (two) times daily. Start taking on:  10/22/2016   clopidogrel 75 MG tablet Commonly known as:  PLAVIX Take 75 mg by mouth daily.   nicotine 14 mg/24hr patch Commonly known as:  NICODERM CQ Place 1 patch (14 mg total) onto the skin daily.   pravastatin 20 MG tablet Commonly known as:  PRAVACHOL Take 20 mg by mouth daily.       Discharge Assessment: Vitals:   10/21/16 1053 10/21/16 1347  BP: (!) 162/83 (!) 150/66  Pulse:  60  Resp:  18  Temp:  98.1 F (36.7 C)  Skin clean, dry and intact without evidence of skin break down, no evidence of skin tears noted. IV catheter discontinued intact. Site without signs and symptoms of complications - no redness or edema noted at insertion site, patient denies c/o pain - only slight tenderness at site.  Dressing with slight pressure applied.  D/c Instructions-Education: Discharge instructions given to patient/family with verbalized understanding. D/c education completed with patient/family including follow up instructions, medication list, d/c activities limitations if indicated, with other d/c instructions as indicated by MD - patient able to verbalize understanding, all questions fully answered. Patient instructed to  return to ED, call 911, or call MD for any changes in condition.  Patient verified he had all belongings. Patient escorted via WC, and D/C home via private auto.  Lenord CarboAubrey  Idy Rawling, RN 10/21/2016

## 2016-10-22 LAB — CULTURE, BLOOD (ROUTINE X 2)

## 2016-10-26 LAB — CULTURE, BLOOD (ROUTINE X 2)
CULTURE: NO GROWTH
Culture: NO GROWTH

## 2016-11-15 ENCOUNTER — Other Ambulatory Visit: Payer: Self-pay | Admitting: Internal Medicine

## 2016-11-30 ENCOUNTER — Other Ambulatory Visit: Payer: Self-pay | Admitting: Internal Medicine

## 2018-10-18 DEATH — deceased

## 2019-01-02 IMAGING — DX DG CHEST 2V
2 series · 2 of 2 positions shown · non-contrast
Comparison: None.

CLINICAL DATA: Altered mental status

EXAM:
CHEST  2 VIEW

[x chest ap]
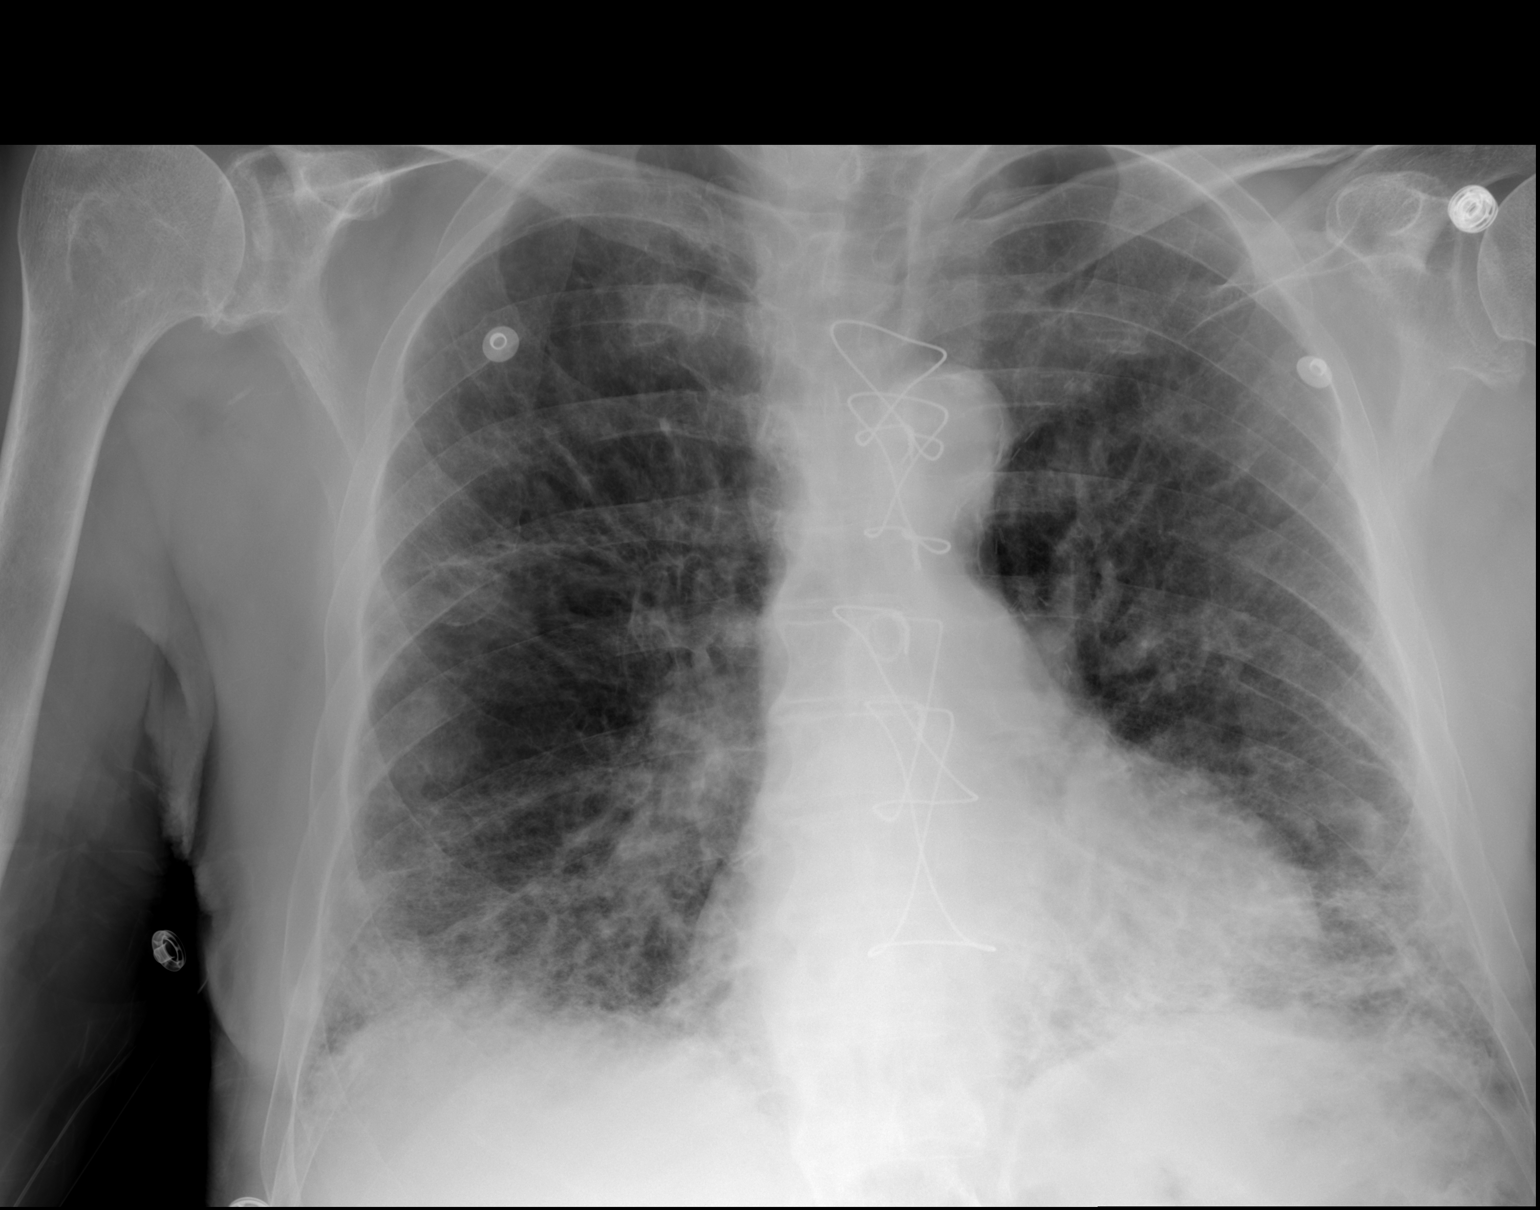

[w chest lat]
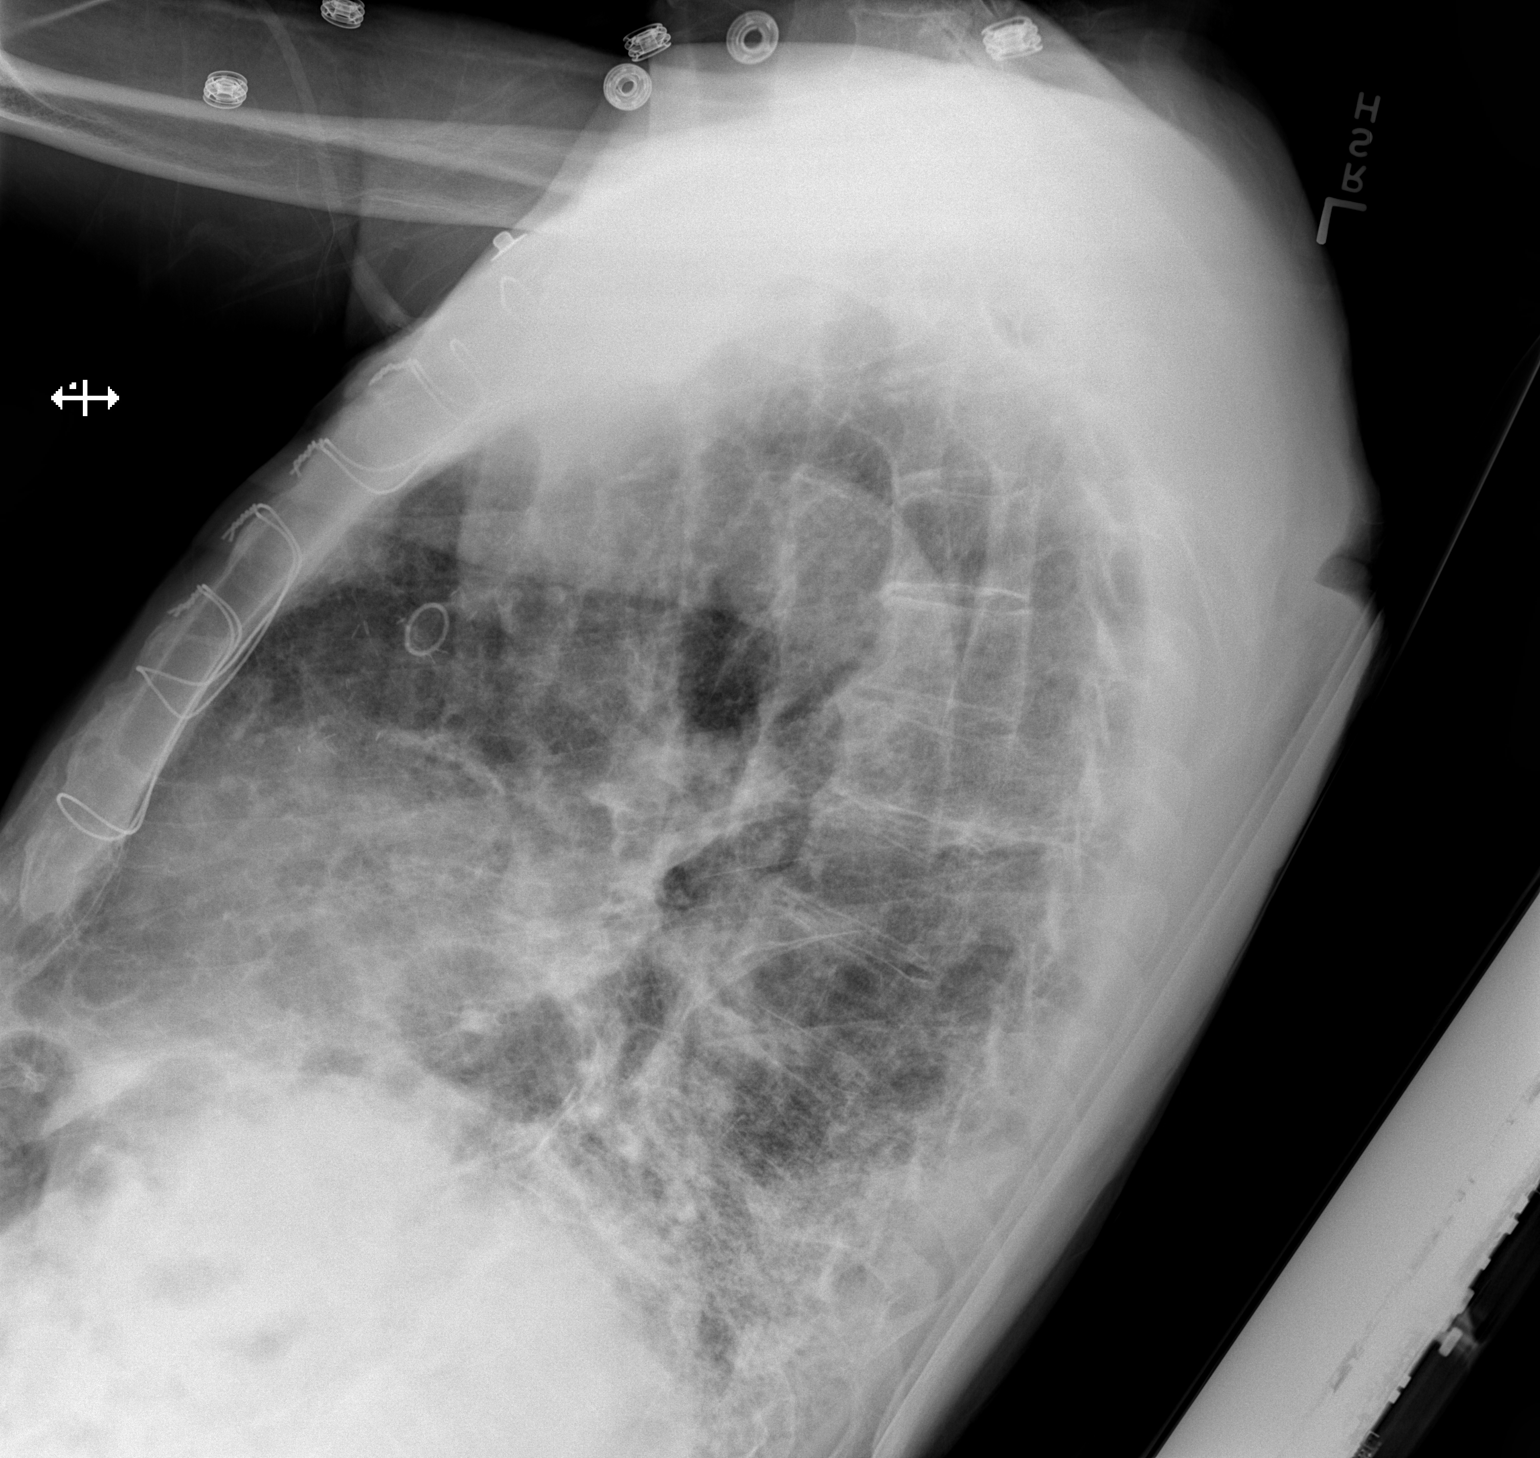

[2 of 2 positions shown; findings below may reference images not displayed]

FINDINGS: Bilateral basilar opacity with interstitial and airspace components.
There is generalized interstitial coarsening and probable
emphysematous change. Borderline cardiomegaly. Status post CABG.
IMPRESSION: Bibasilar pneumonia and/or aspiration.

Background chronic lung disease which could be COPD and/or fibrosis.

## 2019-01-02 IMAGING — CT CT HEAD W/O CM
3 of 4 series · 17 of 47 positions shown, 20 images · non-contrast
Comparison: None.

CLINICAL DATA: Mental status changes.

EXAM:
CT HEAD WITHOUT CONTRAST
TECHNIQUE: Contiguous axial images were obtained from the base of the skull
through the vertex without intravenous contrast.

[Series 201: head w/o, idose (1) · axial · non-contrast · 0.46mm/px · z∈[+59,+189]mm · 11 of 32 slices shown, 14 images]
[im 3/32  brain]
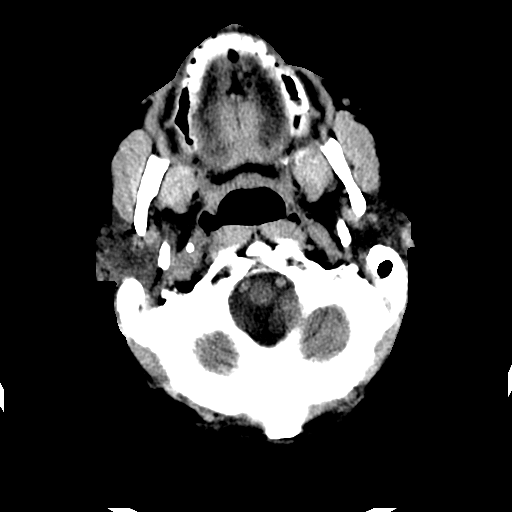
[im 3/32  bone]
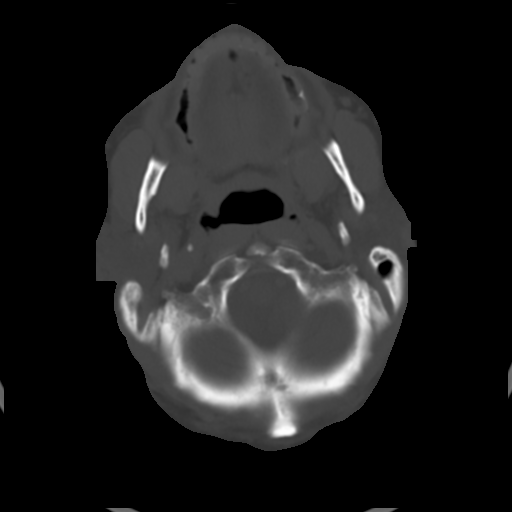
[im 5/32  brain]
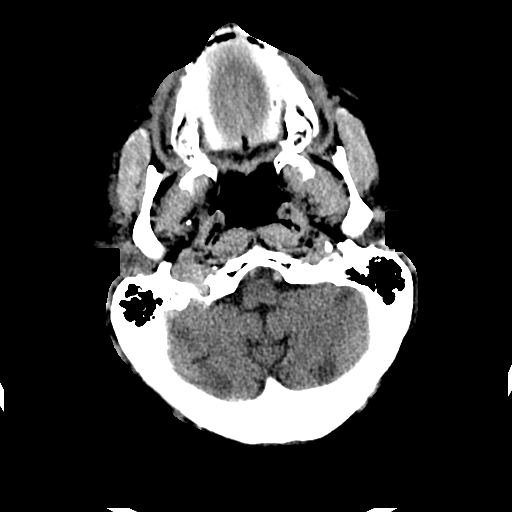
[im 7/32  brain]
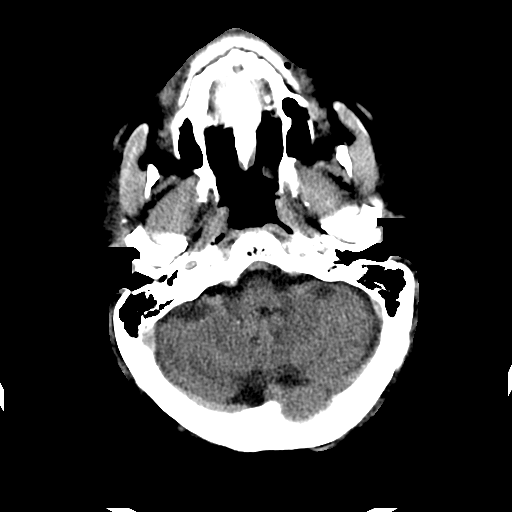
[im 12/32  brain]
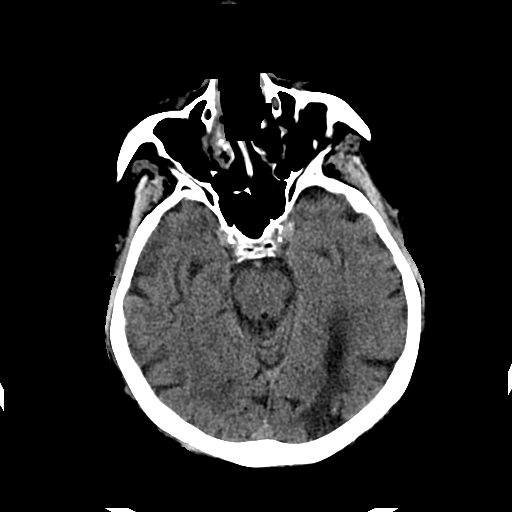
[im 14/32  brain]
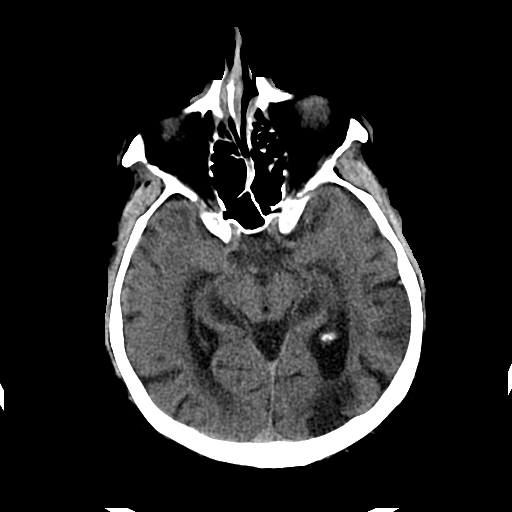
[im 14/32  bone]
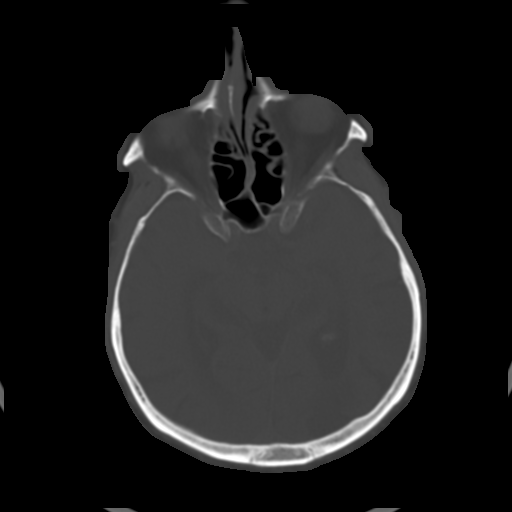
[im 16/32  brain]
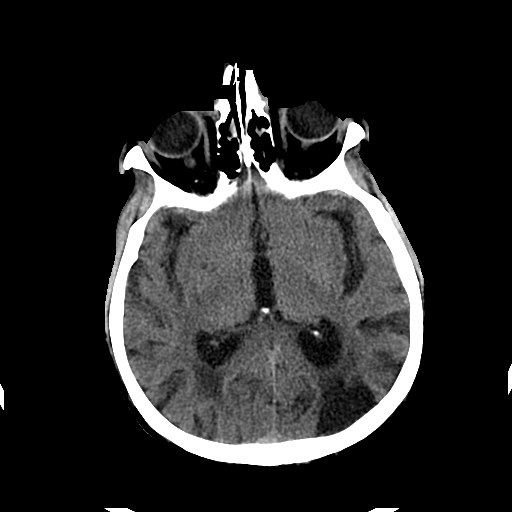
[im 18/32  brain]
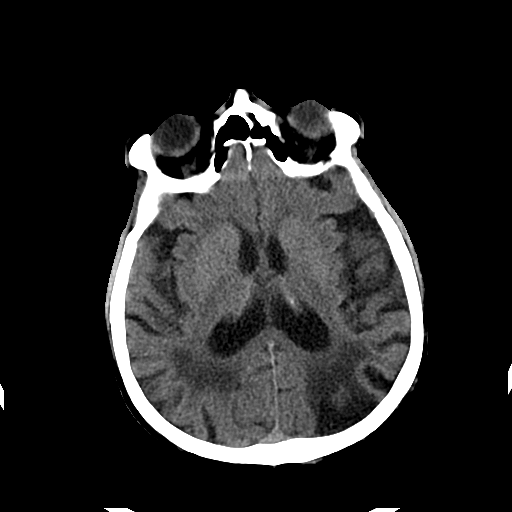
[im 20/32  brain]
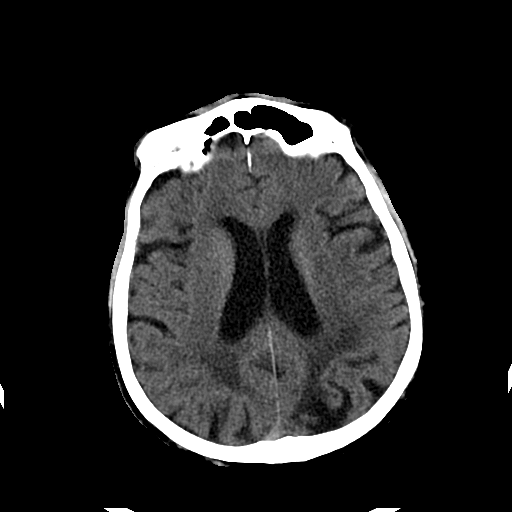
[im 25/32  brain]
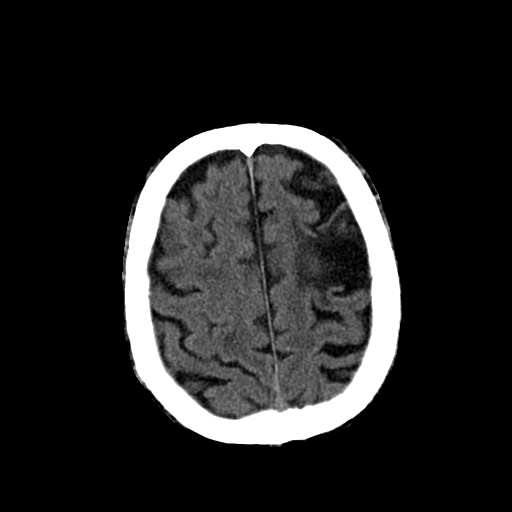
[im 25/32  bone]
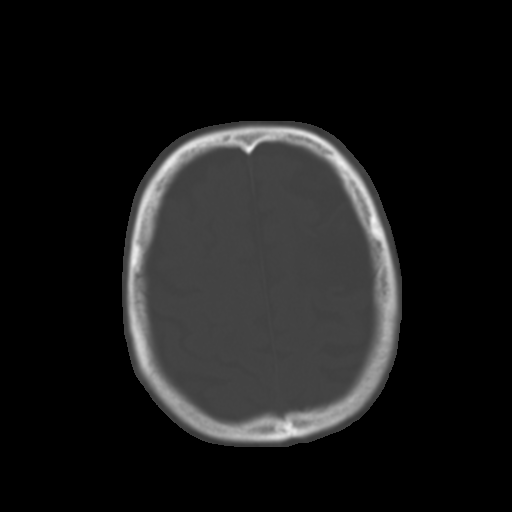
[im 27/32  brain]
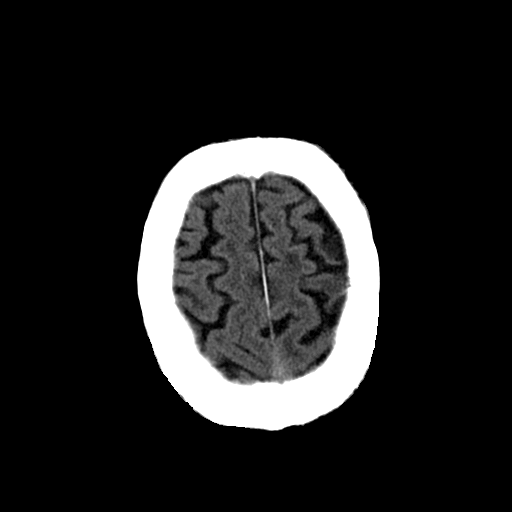
[im 29/32  brain]
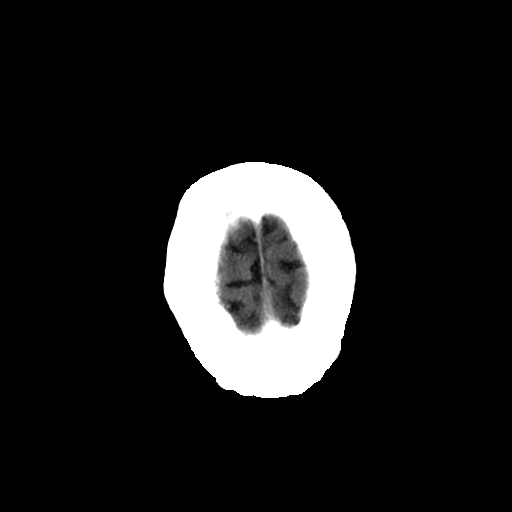

[Series 204: sagittal st, idose (1) · sagittal · 0.40mm/px · 3 of 74 slices shown]
[im 25/74  brain]
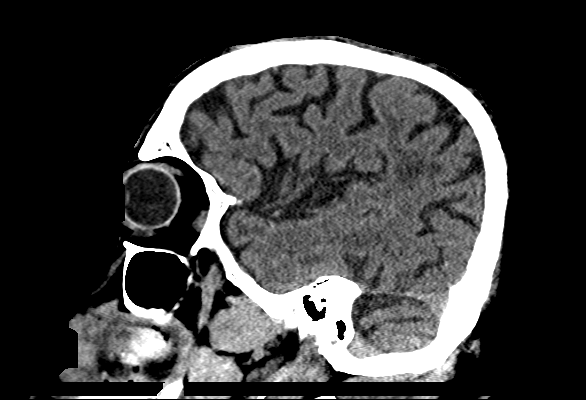
[im 37/74  brain]
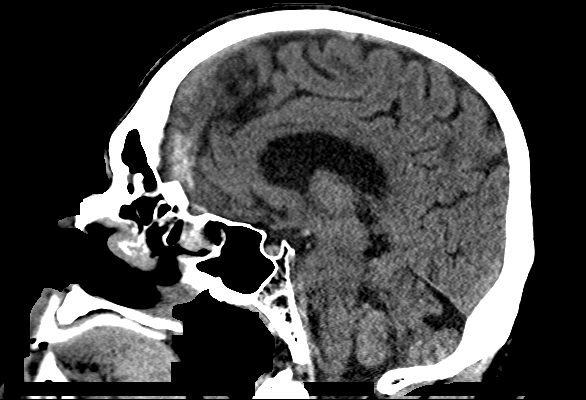
[im 49/74  brain]
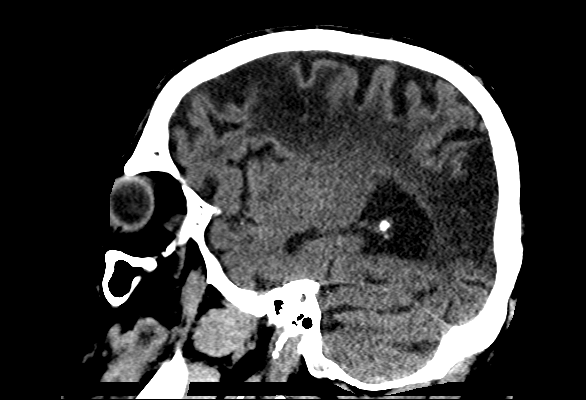

[Series 205: coronal st, idose (1) · coronal · 0.40mm/px · 3 of 73 slices shown]
[im 25/73  brain]
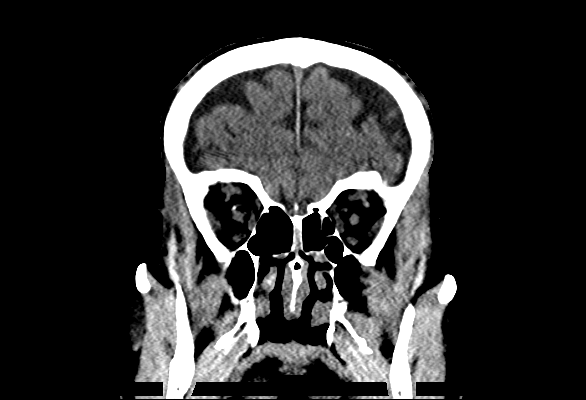
[im 33/73  brain]
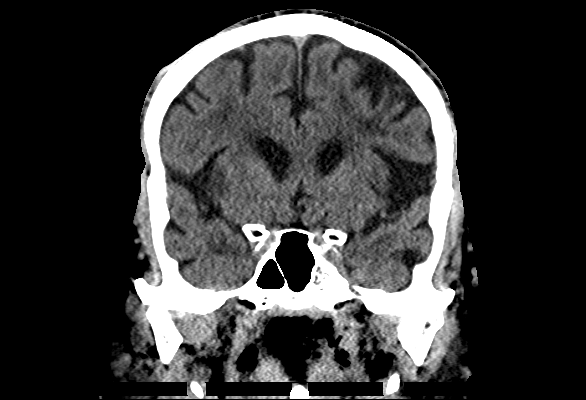
[im 41/73  brain]
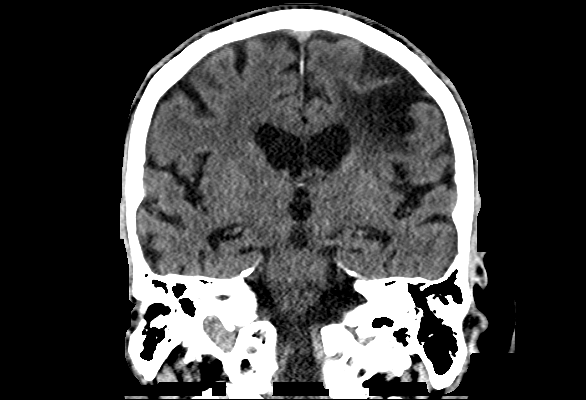

[17 of 47 positions shown; findings below may reference images not displayed]

FINDINGS: Brain: There is no evidence for acute hemorrhage, hydrocephalus,
mass lesion, or abnormal extra-axial fluid collection. No definite
CT evidence for acute infarction. Diffuse loss of parenchymal volume
is consistent with atrophy. Patchy low attenuation in the deep
hemispheric and periventricular white matter is nonspecific, but
likely reflects chronic microvascular ischemic demyelination. Old
infarcts are seen in the left frontoparietal region and
parieto-occipital region. Lacunar infarcts noted inferior left
cerebellum and right basal ganglia.

Vascular: Atherosclerotic calcification is visualized in the carotid
arteries. No dense MCA sign. Major dural sinuses are unremarkable.

Skull: No evidence for fracture. No worrisome lytic or sclerotic
lesion.

Sinuses/Orbits: The visualized paranasal sinuses and mastoid air
cells are clear. Visualized portions of the globes and intraorbital
fat are unremarkable.

Other: None.
IMPRESSION: 1. No acute intracranial abnormality.
2. Atrophy with chronic small vessel white matter ischemic disease.
3. Old left hemispheric infarcts with lacunar infarcts noted in the
inferior left cerebellum and right basal ganglia.
